# Patient Record
Sex: Female | Born: 1980 | Race: White | Hispanic: No | Smoking: Current every day smoker
Health system: Southern US, Community
[De-identification: ages and names within clinical notes are randomized; demographics above are authoritative.]

## PROBLEM LIST (undated history)

## (undated) DIAGNOSIS — F329 Major depressive disorder, single episode, unspecified: Secondary | ICD-10-CM

## (undated) DIAGNOSIS — F32A Depression, unspecified: Secondary | ICD-10-CM

## (undated) DIAGNOSIS — F988 Other specified behavioral and emotional disorders with onset usually occurring in childhood and adolescence: Secondary | ICD-10-CM

## (undated) DIAGNOSIS — E079 Disorder of thyroid, unspecified: Secondary | ICD-10-CM

## (undated) HISTORY — DX: Disorder of thyroid, unspecified: E07.9

## (undated) HISTORY — DX: Other specified behavioral and emotional disorders with onset usually occurring in childhood and adolescence: F98.8

## (undated) HISTORY — DX: Major depressive disorder, single episode, unspecified: F32.9

## (undated) HISTORY — DX: Depression, unspecified: F32.A

---

## 2011-05-15 LAB — HEPATIC FUNCTION PANEL
AST: 14 U/L (ref 13–35)
Alkaline Phosphatase: 58 U/L (ref 25–125)

## 2011-05-15 LAB — BASIC METABOLIC PANEL
BUN: 12 mg/dL (ref 4–21)
Glucose: 93 mg/dL
Sodium: 138 mmol/L (ref 137–147)

## 2011-05-15 LAB — LIPID PANEL
HDL: 55 mg/dL (ref 35–70)
LDL Cholesterol: 92 mg/dL

## 2011-05-15 LAB — HEMOGLOBIN A1C: Hgb A1c MFr Bld: 5.1 % (ref 4.0–6.0)

## 2012-02-19 ENCOUNTER — Ambulatory Visit (INDEPENDENT_AMBULATORY_CARE_PROVIDER_SITE_OTHER): Payer: Self-pay | Admitting: Physician Assistant

## 2012-02-19 ENCOUNTER — Encounter: Payer: Self-pay | Admitting: Physician Assistant

## 2012-02-19 VITALS — BP 124/78 | HR 72 | Temp 98.1°F | Resp 18 | Ht 64.0 in | Wt 204.0 lb

## 2012-02-19 DIAGNOSIS — F32A Depression, unspecified: Secondary | ICD-10-CM

## 2012-02-19 DIAGNOSIS — N390 Urinary tract infection, site not specified: Secondary | ICD-10-CM

## 2012-02-19 DIAGNOSIS — F329 Major depressive disorder, single episode, unspecified: Secondary | ICD-10-CM | POA: Insufficient documentation

## 2012-02-19 DIAGNOSIS — E079 Disorder of thyroid, unspecified: Secondary | ICD-10-CM | POA: Insufficient documentation

## 2012-02-19 DIAGNOSIS — F988 Other specified behavioral and emotional disorders with onset usually occurring in childhood and adolescence: Secondary | ICD-10-CM

## 2012-02-19 DIAGNOSIS — R300 Dysuria: Secondary | ICD-10-CM | POA: Insufficient documentation

## 2012-02-19 DIAGNOSIS — Z23 Encounter for immunization: Secondary | ICD-10-CM

## 2012-02-19 DIAGNOSIS — E039 Hypothyroidism, unspecified: Secondary | ICD-10-CM

## 2012-02-19 LAB — POCT URINALYSIS DIPSTICK
Glucose, UA: NEGATIVE
Nitrite, UA: NEGATIVE
Urobilinogen, UA: 0.2

## 2012-02-19 MED ORDER — METHYLPHENIDATE HCL 10 MG PO TABS: 10.0000 mg | ORAL_TABLET | Freq: Two times a day (BID) | ORAL | Status: DC

## 2012-02-19 MED ORDER — CIPROFLOXACIN HCL 500 MG PO TABS: 500.0000 mg | ORAL_TABLET | Freq: Two times a day (BID) | ORAL | Status: DC

## 2012-02-19 MED ORDER — LEVOTHYROXINE SODIUM 25 MCG PO TABS: 25.0000 ug | ORAL_TABLET | Freq: Every day | ORAL | Status: DC

## 2012-02-19 MED ORDER — FLUOXETINE HCL 10 MG PO CAPS: 10.0000 mg | ORAL_CAPSULE | Freq: Every day | ORAL | Status: DC

## 2012-02-19 NOTE — Patient Instructions (Addendum)
Cipro for UTI.   Refilled medications. Follow up in 6 months.   Urinary Tract Infection Urinary tract infections (UTIs) can develop anywhere along your urinary tract. Your urinary tract is your body's drainage system for removing wastes and extra water. Your urinary tract includes two kidneys, two ureters, a bladder, and a urethra. Your kidneys are a pair of bean-shaped organs. Each kidney is about the size of your fist. They are located below your ribs, one on each side of your spine. CAUSES Infections are caused by microbes, which are microscopic organisms, including fungi, viruses, and bacteria. These organisms are so small that they can only be seen through a microscope. Bacteria are the microbes that most commonly cause UTIs. SYMPTOMS  Symptoms of UTIs may vary by age and gender of the patient and by the location of the infection. Symptoms in young women typically include a frequent and intense urge to urinate and a painful, burning feeling in the bladder or urethra during urination. Older women and men are more likely to be tired, shaky, and weak and have muscle aches and abdominal pain. A fever may mean the infection is in your kidneys. Other symptoms of a kidney infection include pain in your back or sides below the ribs, nausea, and vomiting. DIAGNOSIS To diagnose a UTI, your caregiver will ask you about your symptoms. Your caregiver also will ask to provide a urine sample. The urine sample will be tested for bacteria and white blood cells. White blood cells are made by your body to help fight infection. TREATMENT  Typically, UTIs can be treated with medication. Because most UTIs are caused by a bacterial infection, they usually can be treated with the use of antibiotics. The choice of antibiotic and length of treatment depend on your symptoms and the type of bacteria causing your infection. HOME CARE INSTRUCTIONS  If you were prescribed antibiotics, take them exactly as your caregiver  instructs you. Finish the medication even if you feel better after you have only taken some of the medication.  Drink enough water and fluids to keep your urine clear or pale yellow.  Avoid caffeine, tea, and carbonated beverages. They tend to irritate your bladder.  Empty your bladder often. Avoid holding urine for long periods of time.  Empty your bladder before and after sexual intercourse.  After a bowel movement, women should cleanse from front to back. Use each tissue only once. SEEK MEDICAL CARE IF:   You have back pain.  You develop a fever.  Your symptoms do not begin to resolve within 3 days. SEEK IMMEDIATE MEDICAL CARE IF:   You have severe back pain or lower abdominal pain.  You develop chills.  You have nausea or vomiting.  You have continued burning or discomfort with urination. MAKE SURE YOU:   Understand these instructions.  Will watch your condition.  Will get help right away if you are not doing well or get worse. Document Released: 11/28/2004 Document Revised: 08/20/2011 Document Reviewed: 03/29/2011 [facility] Patient Information 2013 [place], [place].

## 2012-02-20 ENCOUNTER — Telehealth: Payer: Self-pay | Admitting: *Deleted

## 2012-02-20 MED ORDER — FLUOXETINE HCL 20 MG PO TABS: 20.0000 mg | ORAL_TABLET | Freq: Every day | ORAL | Status: DC

## 2012-02-20 NOTE — Telephone Encounter (Signed)
Sent dose change to pharmacy

## 2012-02-20 NOTE — Telephone Encounter (Signed)
Sent appropriate dose to pharmacy.  

## 2012-02-20 NOTE — Telephone Encounter (Signed)
Pt came by office today and had bottle of Prozac and synthroid. States you sent in Prozac 10mg  and she is on the Prozac 20mg - synthroid dose was right. You had not finished your note so I could not tell if there was a supposed to be a change or not

## 2012-02-21 ENCOUNTER — Telehealth: Payer: Self-pay | Admitting: Physician Assistant

## 2012-02-21 ENCOUNTER — Telehealth: Payer: Self-pay | Admitting: *Deleted

## 2012-02-21 DIAGNOSIS — E039 Hypothyroidism, unspecified: Secondary | ICD-10-CM

## 2012-02-21 NOTE — Telephone Encounter (Signed)
Pt.notified

## 2012-02-21 NOTE — Telephone Encounter (Signed)
Please call patient and let her know I never printed out lab slip for [place] to recheck TSH to make sure on the best dose while she was here. We will fax order for TSH down and pt can go at anytime.

## 2012-02-21 NOTE — Telephone Encounter (Signed)
Patient notified she needs to come in to have TSH level drawn. Also, informed her new Prozac was called in.

## 2012-02-21 NOTE — Progress Notes (Signed)
  Subjective:    Patient ID: Mariah Gordon, female    DOB: 1980-07-19, 31 y.o.   MRN: [id number]  HPI Patient presents to the clinic as a new patient. She needs refills that were started about one month ago by another provider. She would like to establish here now. PMH reviewed and positive for new diagnosis of ADD(with no formal testing), Depression, and Hypothyroidism.   She does feel like she is doing much better on these medications. She has no complaints at this time regarding depression/ADD. She has not started back to work yet and states this would be the test if the Ritalin helps her focus.   She is having some urinary symptoms today. She has painful urination and feeling some abdominal pressure. She reports a hx of UTI's and knows when they are coming on. She has increased her water intake but not helped with symptoms. She has had for 2 days now. She denies any flank pain, N/V/D. No fever.   Her last pap was 01/18/12 and Pap was 06/2011.She has not had fasting labs done in a while but wants to wait until we get records or order bloodwork other than TSH.   She does have a family hx of lymphoma and HTN. She smokes daily about 1/2 pack a day for 10 years. She does not exercise.    Review of Systems     Objective:   Physical Exam  Constitutional: She appears well-developed and well-nourished.  HENT:  Head: Normocephalic and atraumatic.  Neck: Normal range of motion. Neck supple. No thyromegaly present.  Cardiovascular: Normal rate, regular rhythm and normal heart sounds.   Pulmonary/Chest: Effort normal and breath sounds normal. She has no wheezes.       No CVA tenderness.  Neurological: She is alert.  Skin: Skin is warm and dry.  Psychiatric: She has a normal mood and affect. Her behavior is normal.          Assessment & Plan:  Hypothyroidism- will recheck labs today. Refilled medications Follow up in 6 months.   Depression-Pt reports 10mg  of Prozac which I will refill  today for 6 months since no insurance. If depression becomes an issue please call office and we can talk about increasing since on such a low dose.   ADD- Will refill Ritalin. Had discussion that I do not start pts on Ritalin and rather choose an extended release instead of immediate release. If not getting the benefits we need to switch to see if we can get better control. Follow up in 3months or when needed. Once patient gets insurance I would also like her to see Dr. Dellia Cloud for a formal evaluation.   UTI- UA positive for Blood but since patient was symptomatic went ahead and treated with Cipro for 3 days. Saved pt culture since no insurance. If not improving call office. Symptomatic care given with handout.   Given Tdap and Flu vaccine without complications.

## 2012-02-22 LAB — TSH: TSH: 9.296 u[IU]/mL — ABNORMAL HIGH (ref 0.350–4.500)

## 2012-02-24 ENCOUNTER — Other Ambulatory Visit: Payer: Self-pay | Admitting: Physician Assistant

## 2012-02-24 MED ORDER — LEVOTHYROXINE SODIUM 75 MCG PO CAPS: 1.0000 | ORAL_CAPSULE | Freq: Every day | ORAL | Status: DC

## 2012-03-23 ENCOUNTER — Other Ambulatory Visit: Payer: Self-pay | Admitting: *Deleted

## 2012-03-23 MED ORDER — FLUOXETINE HCL 20 MG PO TABS: 20.0000 mg | ORAL_TABLET | Freq: Every day | ORAL | Status: DC

## 2012-03-25 ENCOUNTER — Encounter: Payer: Self-pay | Admitting: Physician Assistant

## 2012-03-25 DIAGNOSIS — E782 Mixed hyperlipidemia: Secondary | ICD-10-CM | POA: Insufficient documentation

## 2012-03-26 ENCOUNTER — Encounter: Payer: Self-pay | Admitting: *Deleted

## 2012-04-07 ENCOUNTER — Other Ambulatory Visit: Payer: Self-pay | Admitting: *Deleted

## 2012-04-07 MED ORDER — METHYLPHENIDATE HCL 10 MG PO TABS: 10.0000 mg | ORAL_TABLET | Freq: Two times a day (BID) | ORAL | Status: DC

## 2012-05-15 ENCOUNTER — Other Ambulatory Visit: Payer: Self-pay | Admitting: Physician Assistant

## 2012-06-05 ENCOUNTER — Other Ambulatory Visit: Payer: Self-pay | Admitting: *Deleted

## 2012-06-05 MED ORDER — METHYLPHENIDATE HCL 10 MG PO TABS: 10.0000 mg | ORAL_TABLET | Freq: Two times a day (BID) | ORAL | Status: DC

## 2012-06-08 ENCOUNTER — Encounter: Payer: Self-pay | Admitting: Physician Assistant

## 2012-06-08 ENCOUNTER — Ambulatory Visit (INDEPENDENT_AMBULATORY_CARE_PROVIDER_SITE_OTHER): Payer: Self-pay | Admitting: Physician Assistant

## 2012-06-08 VITALS — BP 115/65 | HR 69 | Wt 231.0 lb

## 2012-06-08 DIAGNOSIS — F32A Depression, unspecified: Secondary | ICD-10-CM

## 2012-06-08 DIAGNOSIS — E039 Hypothyroidism, unspecified: Secondary | ICD-10-CM

## 2012-06-08 DIAGNOSIS — R635 Abnormal weight gain: Secondary | ICD-10-CM

## 2012-06-08 DIAGNOSIS — L659 Nonscarring hair loss, unspecified: Secondary | ICD-10-CM

## 2012-06-08 DIAGNOSIS — N926 Irregular menstruation, unspecified: Secondary | ICD-10-CM

## 2012-06-08 DIAGNOSIS — R195 Other fecal abnormalities: Secondary | ICD-10-CM

## 2012-06-08 DIAGNOSIS — F329 Major depressive disorder, single episode, unspecified: Secondary | ICD-10-CM

## 2012-06-08 DIAGNOSIS — F988 Other specified behavioral and emotional disorders with onset usually occurring in childhood and adolescence: Secondary | ICD-10-CM

## 2012-06-08 MED ORDER — METHYLPHENIDATE HCL 20 MG PO TABS: 20.0000 mg | ORAL_TABLET | Freq: Two times a day (BID) | ORAL | Status: DC

## 2012-06-08 MED ORDER — FLUOXETINE HCL 20 MG PO TABS: 20.0000 mg | ORAL_TABLET | Freq: Every day | ORAL | Status: DC

## 2012-06-08 NOTE — Patient Instructions (Addendum)
Food diary.    Gluten-Free Diet Gluten is a protein found in many grains. Gluten is present in wheat, rye, and barley. Gluten from wheat, rye, and barley protein interferes with the absorption of food in people with gluten sensitivity. It may also cause intestinal injury when eaten by individuals with gluten sensitivity.  A sample piece (biopsy) of the small intestine is usually required for a positive diagnosis of gluten sensitivity. Dietary treatment consists of eliminating foods and food ingredients from wheat, rye, and barley. When these are taken out of the diet completely, most people regain function of the small intestine. Strict compliance is important even during symptom-free periods. People with gluten sensitivity need to be on a gluten-free diet for a lifetime. During the first stages of treatment, some people will also need to restrict dairy products that contain lactose, which is a naturally occurring sugar. Lactose is difficult to absorb when the small intestines are damaged (lactose intolerance).  WHO NEEDS THIS DIET Some people who have certain diseases need to be on a gluten-free diet. These diseases include:  Celiac disease.  Nontropical sprue.  Gluten-sensitive enteropathy.  Dermatitis herpetiformis. SPECIAL NOTES  Read all labels because gluten may have been added as an incidental ingredient. Words to check for on the label include: flour, starch, durum flour, graham flour, phosphated flour, self-rising flour, semolina, farina, modified food starch, cereal, thickening, fillers, emulsifiers, any kind of malt flavoring, and hydrolyzed vegetable protein. A registered dietician can help you identify possible harmful ingredients in the foods you normally eat.  If you are not sure whether an ingredient contains gluten, check with the manufacturer. Note that some manufacturers may change ingredients without notice. Always read labels.  Since flour and cereal products are often  used in the preparation of foods, it is important to be aware of the methods of preparation used, as well as the foods themselves. This is especially true when you are dining out. Starches  Allowed: Only those prepared from arrowroot, corn, potato, rice, and bean flours. Rice wafers(*), pure cornmeal tortillas, popcorn, some crackers, and chips(*). Hot cereals made from cornmeal. Ask your dietician which specific hot and cold cereals are allowed. White or sweet potatoes, yams, hominy, rice or wild rice, and special gluten-free pasta. Some oriental rice noodles or bean noodles.  Avoid: All wheat and rye cereals, wheat germ, barley, bran, graham, malt, bulgur, and millet(-). NOTE: Avoid cereals containing malt as a flavoring, such as rice cereal. Regular noodles, spaghetti, macaroni, and most packaged rice mixes(*). All others containing wheat, rye, or barley. Vegetables  Allowed: All plain, fresh, frozen, or canned vegetables.  Avoid: Creamed vegetables(*) and vegetables canned in sauces(*). Any prepared with wheat, rye, or barley. Fruit  Allowed: All fresh, frozen, canned, or dried fruits. Fruit juices.  Avoid: Thickened or prepared fruits and some pie fillings(*). Meat and Meat Substitutes  Allowed: Meat, fish, poultry, or eggs prepared without added wheat, rye, or barley. Luncheon meat(*), frankfurters(*), and pure meat. All aged cheese and processed cheese products(*). Cottage cheese(+) and cream cheese(+). Dried beans, dried peas, and lentils.  Avoid: Any meat or meat alternate containing wheat, rye, barley, or gluten stabilizers. Bread-containing products, such as Swiss steak, croquettes, and meatloaf. Tuna canned in vegetable broth(*); [place] with HVP injected as part of the basting; any cheese product containing oat gum as an ingredient. Milk  Allowed: Milk. Yogurt made with allowed ingredients(*).  Avoid: Commercial chocolate milk which may have cereal added(*). Malted milk. Soups  and Combination Foods  Allowed: Homemade broth and soups made with allowed ingredients; some canned or frozen soups are allowed(*). Combination or prepared foods that do not contain gluten(*). Read labels.  Avoid: All soups containing wheat, rye, or barley flour. Bouillon and bouillon cubes that contain hydrolyzed vegetable protein (HVP). Combination or prepared foods that contain gluten(*). Desserts  Allowed:  Custard, some pudding mixes(*), homemade puddings from cornstarch, rice, and tapioca. Gelatin desserts, ices, and sherbet(*). Cake, cookies, and other desserts prepared with allowed flours. Some commercial ice creams(*). Ask your dietician about specific brands of dessert that are allowed.  Avoid: Cakes, cookies, doughnuts, and pastries that are prepared with wheat, rye, or barley flour. Some commercial ice creams(*), ice cream flavors which contain cookies, crumbs, or cheesecake(*). Ice cream cones. All commercially prepared mixes for cakes, cookies, and other desserts(*). Bread pudding and other puddings thickened with flour. Sweets  Allowed: Sugar, honey, syrup(*), molasses, jelly, jam, plain hard candy, marshmallows, gumdrops, homemade candies free from wheat, rye, or barley. Coconut.  Avoid: Commercial candies containing wheat, rye, or barley(*). Certain buttercrunch toffees are dusted with wheat flour. Ask your dietician about specific brands that are not allowed. Chocolate-coated nuts, which are often rolled in flour. Fats and Oils  Allowed: Butter, margarine, vegetable oil, sour cream(+), whipping cream, shortening, lard, cream, mayonnaise(*). Some commercial salad dressings(*). Peanut butter.  Avoid: Some commercial salad dressings(*). Beverages  Allowed: Coffee (regular or decaffeinated), tea, herbal tea (read label to be sure that no wheat flour has been added). Carbonated beverages and some root beers(*). Wine, sake, and distilled spirits, such as gin, vodka, and  whiskey.  Avoid:  Certain cereal beverages. Ask your dietician about specific brands that are not allowed. Beer (unless gluten-free), ale, malted milk, and some root beers, wine, and sake. Condiments/ Miscellaneous  Allowed: Salt, pepper, herbs, spices, extracts, and food colorings. Monosodium glutamate (MSG). Cider, rice, and wine vinegar. Baking soda and baking powder. Certain soy sauces. Ask your dietician about specific brands that are allowed. Nuts, coconut, chocolate, and pure cocoa powder.  Avoid: Some curry powder(*), some dry seasoning mixes(*), some gravy extracts(*), some meat sauces(*), some catsup(*), some prepared mustard(*), horseradish(*), some soy sauce(*), chip dips(*), and some chewing gum(*). Yeast extract (contains barley). Caramel color (may contain malt). Ask your dietician about specific brands of condiments to avoid. Flour and Thickening Agents  Allowed: Arrowroot starch (A); Corn bran (B); Corn flour (B,C,D); Corn germ (B); Cornmeal (B,C,D); Corn starch (A); Potato flour (B,C,E); Potato starch flour (B,C,E); Rice bran (B); Rice flours: Plain, brown (B,C,D,E), and Sweet (A,B,C,F). Rice polish (B,C,G); Soy flour (B,C,G); Tapioca starch (A). The flour and thickening agents described above are good for: (A) Good thickening agent (B) Good when combined with other flours (C) Best combined with milk and eggs in baked products (D) Best in grainy-textured products (E) Produces drier product than other flours (F) Produces moister product than other flours (G) Adds distinct flavor to product. Use in moderation. (*) Check labels and investigate any questionable ingredients.  (-) Additional research is needed before this product can be recommended. (+) Check vegetable gum used. SAMPLE MEAL PLAN Breakfast   Orange juice.  Banana.  Rice or corn cereal.  Toast (gluten-free bread).  Heart-healthy tub margarine.  Jam.  Milk.  Coffee or tea. Lunch  Chicken salad  sandwich (with gluten-free bread and mayonnaise).  Sliced tomatoes.  Heart-healthy tub margarine.  Apple.  Milk.  Coffee or tea. Dinner  [place].  Baked potato.  Broccoli.  Lettuce salad  with gluten-free dressing.  Gluten-free bread.  Custard.  Heart-healthy margarine.  Coffee or tea. These meal plans are provided as samples. Your daily meal plans will vary. Document Released: 02/18/2005 Document Revised: 08/20/2011 Document Reviewed: 03/31/2011 [facility] Patient Information 2013 [place], [place].  Irritable Bowel Syndrome Irritable Bowel Syndrome (IBS) is caused by a disturbance of normal bowel function. Other terms used are spastic colon, mucous colitis, and irritable colon. It does not require surgery, nor does it lead to cancer. There is no cure for IBS. But with proper diet, stress reduction, and medication, you will find that your problems (symptoms) will gradually disappear or improve. IBS is a common digestive disorder. It usually appears in late adolescence or early adulthood. Women develop it twice as often as men. CAUSES  After food has been digested and absorbed in the small intestine, waste material is moved into the colon (large intestine). In the colon, water and salts are absorbed from the undigested products coming from the small intestine. The remaining residue, or fecal material, is held for elimination. Under normal circumstances, gentle, rhythmic contractions on the bowel walls push the fecal material along the colon towards the rectum. In IBS, however, these contractions are irregular and poorly coordinated. The fecal material is either retained too long, resulting in constipation, or expelled too soon, producing diarrhea. SYMPTOMS  The most common symptom of IBS is pain. It is typically in the lower left side of the belly (abdomen). But it may occur anywhere in the abdomen. It can be felt as heartburn, backache, or even as a dull pain in the arms or  shoulders. The pain comes from excessive bowel-muscle spasms and from the buildup of gas and fecal material in the colon. This pain:  Can range from sharp belly (abdominal) cramps to a dull, continuous ache.  Usually worsens soon after eating.  Is typically relieved by having a bowel movement or passing gas. Abdominal pain is usually accompanied by constipation. But it may also produce diarrhea. The diarrhea typically occurs right after a meal or upon arising in the morning. The stools are typically soft and watery. They are often flecked with secretions (mucus). Other symptoms of IBS include:  Bloating.  Loss of appetite.  Heartburn.  Feeling sick to your stomach (nausea).  Belching  Vomiting  Gas. IBS may also cause a number of symptoms that are unrelated to the digestive system:  Fatigue.  Headaches.  Anxiety  Shortness of breath  Difficulty in concentrating.  Dizziness. These symptoms tend to come and go. DIAGNOSIS  The symptoms of IBS closely mimic the symptoms of other, more serious digestive disorders. So your caregiver may wish to perform a variety of additional tests to exclude these disorders. He/she wants to be certain of learning what is wrong (diagnosis). The nature and purpose of each test will be explained to you. TREATMENT A number of medications are available to help correct bowel function and/or relieve bowel spasms and abdominal pain. Among the drugs available are:  Mild, non-irritating laxatives for severe constipation and to help restore normal bowel habits.  Specific anti-diarrheal medications to treat severe or prolonged diarrhea.  Anti-spasmodic agents to relieve intestinal cramps.  Your caregiver may also decide to treat you with a mild tranquilizer or sedative during unusually stressful periods in your life. The important thing to remember is that if any drug is prescribed for you, make sure that you take it exactly as directed. Make sure  that your caregiver knows how well it worked for  you. HOME CARE INSTRUCTIONS   Avoid foods that are high in fat or oils. Some examples WUJ:[id number] cream, butter, frankfurters, sausage, and other fatty meats.  Avoid foods that have a laxative effect, such as fruit, fruit juice, and dairy products.  Cut out carbonated drinks, chewing gum, and "gassy" foods, such as beans and cabbage. This may help relieve bloating and belching.  Bran taken with plenty of liquids may help relieve constipation.  Keep track of what foods seem to trigger your symptoms.  Avoid emotionally charged situations or circumstances that produce anxiety.  Start or continue exercising.  Get plenty of rest and sleep. MAKE SURE YOU:   Understand these instructions.  Will watch your condition.  Will get help right away if you are not doing well or get worse. Document Released: 02/18/2005 Document Revised: 05/13/2011 Document Reviewed: 10/09/2007 [facility] Patient Information 2013 [place], [place].

## 2012-06-08 NOTE — Progress Notes (Signed)
  Subjective:    Patient ID: Mariah Gordon, female    DOB: 1980-08-03, 32 y.o.   MRN: [id number]  HPI Patient presents to the clinic to follow up on ADD medications and to address some other concerns.   ADD- feels like she is doing "ok" feels like her focus could be better and medication not lasting long enough. She knows there are better medication out there but due to no ins needs very cheap medications. Denies any anorexia, insomnia, CP, or palpitations.   Weight gain/hypothyroidism- She has actually gain almost 30lbs in 3 months since her last visit. She did start levothyroxine 3 months ago and TSH has not been checked. She is very concerned about weight gain, hair starting to fall out again and periods becoming more irregular. She admits she is not keeping to a diet and eats a lot of sweets but does not feel it should be a 30lb weight gain. She does have BTL. And took a negative pregnancy test.   Depression- controlled has been on prozac for awhile and does not think contributing to weight gain.   She is also having some mucus in her stools and sometimes they leak. She did use laxatives for weight loss years ago and wonders if that could be effecting colon. She admits to abdominal pain and cramping after eating. She has not tried any diet changes. Denies any blood in stool.    Review of Systems     Objective:   Physical Exam  Constitutional: She is oriented to person, place, and time. She appears well-developed and well-nourished.  Obese.  HENT:  Head: Normocephalic and atraumatic.  Thining of hair around top of head.   Neck: Normal range of motion. Neck supple. No thyromegaly present.  Cardiovascular: Normal rate, regular rhythm and normal heart sounds.   Pulmonary/Chest: Effort normal and breath sounds normal.  Abdominal: Soft. Bowel sounds are normal. She exhibits no distension and no mass. There is no tenderness. There is no rebound and no guarding.  Neurological: She is alert  and oriented to person, place, and time.  Skin: Skin is warm and dry.  Psychiatric: She has a normal mood and affect. Her behavior is normal.          Assessment & Plan:  Hypothyroidism-will recheck TSH today and adjust medications accordingly. Discussed with pt hair loss and weight gain should not be a side effect of levothyroxine infact it usually helps those things. Dicussed need for regular exercise and healthy diet. Which could help with weight loss.  Depression- Refilled prozac.   ADD- will increase ritalin today to 20mg  BID in hopes to improving focus and attention. Will follow up in 3 months.   Menstrual abnormalities- pt does not have insurance and concerned about price of all lab work. Discussed pychogenic causes of menstrual abnormalities. Checking TSH. Will add FSH, prolactin, LH, Testosterone.   Bowel changes- Mucus in stool and some leakage. Discussed could be IBS symptoms. Try some diet changes and food diaries and see if improves. Gave gluten free diet and ask pt to consider no dairy. If this does not help call office. If not cleared up should consider colonoscopy but aware will be costly.

## 2012-06-13 LAB — TSH: TSH: 2.766 u[IU]/mL (ref 0.350–4.500)

## 2012-07-31 ENCOUNTER — Other Ambulatory Visit: Payer: Self-pay | Admitting: *Deleted

## 2012-07-31 MED ORDER — LEVOTHYROXINE SODIUM 75 MCG PO TABS: ORAL_TABLET | ORAL | Status: DC

## 2012-08-19 ENCOUNTER — Ambulatory Visit: Payer: Self-pay | Admitting: Physician Assistant

## 2012-08-19 DIAGNOSIS — Z0289 Encounter for other administrative examinations: Secondary | ICD-10-CM

## 2012-10-23 ENCOUNTER — Other Ambulatory Visit: Payer: Self-pay | Admitting: Physician Assistant

## 2012-10-28 ENCOUNTER — Ambulatory Visit (INDEPENDENT_AMBULATORY_CARE_PROVIDER_SITE_OTHER): Payer: Self-pay | Admitting: Physician Assistant

## 2012-10-28 ENCOUNTER — Encounter: Payer: Self-pay | Admitting: Physician Assistant

## 2012-10-28 VITALS — BP 119/76 | HR 80 | Wt 229.0 lb

## 2012-10-28 DIAGNOSIS — F988 Other specified behavioral and emotional disorders with onset usually occurring in childhood and adolescence: Secondary | ICD-10-CM

## 2012-10-28 DIAGNOSIS — R109 Unspecified abdominal pain: Secondary | ICD-10-CM

## 2012-10-28 DIAGNOSIS — R300 Dysuria: Secondary | ICD-10-CM

## 2012-10-28 DIAGNOSIS — N390 Urinary tract infection, site not specified: Secondary | ICD-10-CM

## 2012-10-28 DIAGNOSIS — E079 Disorder of thyroid, unspecified: Secondary | ICD-10-CM

## 2012-10-28 LAB — POCT URINALYSIS DIPSTICK
Nitrite, UA: POSITIVE
Urobilinogen, UA: 0.2
pH, UA: 6

## 2012-10-28 MED ORDER — TRAMADOL HCL 50 MG PO TABS: 50.0000 mg | ORAL_TABLET | Freq: Three times a day (TID) | ORAL | Status: DC | PRN

## 2012-10-28 MED ORDER — LEVOTHYROXINE SODIUM 75 MCG PO TABS: 75.0000 ug | ORAL_TABLET | Freq: Every day | ORAL | Status: DC

## 2012-10-28 MED ORDER — METHYLPHENIDATE HCL 20 MG PO TABS: 20.0000 mg | ORAL_TABLET | Freq: Two times a day (BID) | ORAL | Status: DC

## 2012-10-28 MED ORDER — PHENAZOPYRIDINE HCL 200 MG PO TABS: 200.0000 mg | ORAL_TABLET | Freq: Three times a day (TID) | ORAL | Status: DC | PRN

## 2012-10-28 MED ORDER — NITROFURANTOIN MONOHYD MACRO 100 MG PO CAPS: 100.0000 mg | ORAL_CAPSULE | Freq: Two times a day (BID) | ORAL | Status: DC

## 2012-10-28 NOTE — Patient Instructions (Addendum)
Urinary Tract Infection  Urinary tract infections (UTIs) can develop anywhere along your urinary tract. Your urinary tract is your body's drainage system for removing wastes and extra water. Your urinary tract includes two kidneys, two ureters, a bladder, and a urethra. Your kidneys are a pair of bean-shaped organs. Each kidney is about the size of your fist. They are located below your ribs, one on each side of your spine.  CAUSES  Infections are caused by microbes, which are microscopic organisms, including fungi, viruses, and bacteria. These organisms are so small that they can only be seen through a microscope. Bacteria are the microbes that most commonly cause UTIs.  SYMPTOMS   Symptoms of UTIs may vary by age and gender of the patient and by the location of the infection. Symptoms in young women typically include a frequent and intense urge to urinate and a painful, burning feeling in the bladder or urethra during urination. Older women and men are more likely to be tired, shaky, and weak and have muscle aches and abdominal pain. A fever may mean the infection is in your kidneys. Other symptoms of a kidney infection include pain in your back or sides below the ribs, nausea, and vomiting.  DIAGNOSIS  To diagnose a UTI, your caregiver will ask you about your symptoms. Your caregiver also will ask to provide a urine sample. The urine sample will be tested for bacteria and white blood cells. White blood cells are made by your body to help fight infection.  TREATMENT   Typically, UTIs can be treated with medication. Because most UTIs are caused by a bacterial infection, they usually can be treated with the use of antibiotics. The choice of antibiotic and length of treatment depend on your symptoms and the type of bacteria causing your infection.  HOME CARE INSTRUCTIONS   If you were prescribed antibiotics, take them exactly as your caregiver instructs you. Finish the medication even if you feel better after you  have only taken some of the medication.   Drink enough water and fluids to keep your urine clear or pale yellow.   Avoid caffeine, tea, and carbonated beverages. They tend to irritate your bladder.   Empty your bladder often. Avoid holding urine for long periods of time.   Empty your bladder before and after sexual intercourse.   After a bowel movement, women should cleanse from front to back. Use each tissue only once.  SEEK MEDICAL CARE IF:    You have back pain.   You develop a fever.   Your symptoms do not begin to resolve within 3 days.  SEEK IMMEDIATE MEDICAL CARE IF:    You have severe back pain or lower abdominal pain.   You develop chills.   You have nausea or vomiting.   You have continued burning or discomfort with urination.  MAKE SURE YOU:    Understand these instructions.   Will watch your condition.   Will get help right away if you are not doing well or get worse.  Document Released: 11/28/2004 Document Revised: 08/20/2011 Document Reviewed: 03/29/2011  [employer] Patient Information 2014 [employer]

## 2012-10-28 NOTE — Progress Notes (Signed)
  Subjective:    Patient ID: Mariah Gordon, female    DOB: 07-Jul-1980, 32 y.o.   MRN: [id number]  HPI Patient presents to the clinic with dysuria, low back pain, lower abdominal pressure since yesterday. She does admit to having a urine odor for the last 2 days. Last night pain was so bad she almost at emergency room. She's been drinking more water but has not tried taking anything. She does have a history of urinary tract infections. She's not had one in a while because she is not sexually active. She denies any discharge. She does not had any fevers, chills, nausea, vomiting.  Hypothyroidism-needs refill. She denies any issues or difficulties.  A DD-patient is currently on Ritalin. She needs a refill today. She has not been working over the summer and did not take it but if starting back to work and needs to have refill. She denies any chest pains, palpitations, insomnia, anorexia.   Review of Systems     Objective:   Physical Exam  Constitutional: She is oriented to person, place, and time. She appears well-developed and well-nourished.  HENT:  Head: Normocephalic and atraumatic.  Neck: Normal range of motion. Neck supple. No thyromegaly present.  Cardiovascular: Normal rate, regular rhythm and normal heart sounds.   Pulmonary/Chest: Effort normal and breath sounds normal.  Negative for CVA tenderness.  Abdominal: Soft. Bowel sounds are normal.   Mild discomfort over the lower abdominal area.  Neurological: She is alert and oriented to person, place, and time.  Skin: Skin is warm and dry.  Psychiatric: She has a normal mood and affect. Her behavior is normal.          Assessment & Plan:  UTI-patient's urine dipstick was positive for blood, nitrates, leukocytes. She was treated with Macrobid and given Pyridium for bladder inflammation. TTT intense pain she was also given Ultram to use as needed. Vascular try anti-inflammatories also if needed. Gave handout on UTI. Encourage  hydration. If not starting to improve by Friday please call office.  Hypothyroidism- refilled Synthroid today.  ADHD-Ritalin was refilled today. Patient is good for the next 6 months. She may call in for refills.

## 2012-11-11 ENCOUNTER — Ambulatory Visit (INDEPENDENT_AMBULATORY_CARE_PROVIDER_SITE_OTHER): Payer: Self-pay | Admitting: Physician Assistant

## 2012-11-11 ENCOUNTER — Telehealth: Payer: Self-pay | Admitting: *Deleted

## 2012-11-11 ENCOUNTER — Encounter: Payer: Self-pay | Admitting: *Deleted

## 2012-11-11 VITALS — BP 119/60 | HR 71 | Wt 231.0 lb

## 2012-11-11 DIAGNOSIS — N390 Urinary tract infection, site not specified: Secondary | ICD-10-CM

## 2012-11-11 DIAGNOSIS — R82998 Other abnormal findings in urine: Secondary | ICD-10-CM

## 2012-11-11 LAB — POCT URINALYSIS DIPSTICK
Ketones, UA: NEGATIVE
Leukocytes, UA: NEGATIVE
Protein, UA: NEGATIVE
Urobilinogen, UA: 0.2
pH, UA: 7

## 2012-11-11 NOTE — Telephone Encounter (Signed)
Pt transferred to schedule appt for UA dipstick.  Meyer Cory, LPN

## 2012-11-11 NOTE — Progress Notes (Signed)
  Subjective:    Patient ID: Mariah Gordon, female    DOB: [birth year], 32 y.o.   MRN: [id number] Patient here for UA dip HPI    Review of Systems     Objective:   Physical Exam        Assessment & Plan:  UA positive for blood but no signs of infection. Will send urine for culture to make sure there is not a infection. If patient is having symptoms she needs to be seen so we can determine if they could be BV or yeast-related.Tandy Gaw PA-C

## 2012-11-11 NOTE — Telephone Encounter (Signed)
Pt states she finished the abx you gave her for UTI but states she is still having the same symptoms. She would like to know if you would send more abx to pharmacy.

## 2012-11-11 NOTE — Telephone Encounter (Signed)
I would like to at least recheck urine dipstick. I did not do a culture so not aware of sensitivies. Come back today and give sample.

## 2012-11-13 LAB — URINE CULTURE: Colony Count: >=100000

## 2012-11-14 ENCOUNTER — Other Ambulatory Visit: Payer: Self-pay | Admitting: Physician Assistant

## 2012-11-14 MED ORDER — CIPROFLOXACIN HCL 500 MG PO TABS: 500.0000 mg | ORAL_TABLET | Freq: Two times a day (BID) | ORAL | Status: DC

## 2013-01-11 ENCOUNTER — Other Ambulatory Visit: Payer: Self-pay

## 2013-01-11 MED ORDER — METHYLPHENIDATE HCL 20 MG PO TABS: 20.0000 mg | ORAL_TABLET | Freq: Two times a day (BID) | ORAL | Status: DC

## 2013-02-12 ENCOUNTER — Ambulatory Visit (INDEPENDENT_AMBULATORY_CARE_PROVIDER_SITE_OTHER): Payer: Self-pay | Admitting: Sports Medicine

## 2013-02-12 ENCOUNTER — Encounter: Payer: Self-pay | Admitting: Sports Medicine

## 2013-02-12 ENCOUNTER — Other Ambulatory Visit: Payer: Self-pay | Admitting: *Deleted

## 2013-02-12 VITALS — BP 143/78 | HR 92 | Wt 214.0 lb

## 2013-02-12 DIAGNOSIS — R42 Dizziness and giddiness: Secondary | ICD-10-CM

## 2013-02-12 DIAGNOSIS — E079 Disorder of thyroid, unspecified: Secondary | ICD-10-CM

## 2013-02-12 DIAGNOSIS — R10819 Abdominal tenderness, unspecified site: Secondary | ICD-10-CM

## 2013-02-12 DIAGNOSIS — R110 Nausea: Secondary | ICD-10-CM | POA: Insufficient documentation

## 2013-02-12 LAB — POCT URINALYSIS DIPSTICK
Bilirubin, UA: NEGATIVE
Blood, UA: NEGATIVE
Glucose, UA: NEGATIVE
Ketones, UA: NEGATIVE
Leukocytes, UA: NEGATIVE
Nitrite, UA: NEGATIVE
Protein, UA: NEGATIVE
Spec Grav, UA: 1.025
Urobilinogen, UA: 0.2
pH, UA: 7

## 2013-02-12 LAB — POCT URINE PREGNANCY: Preg Test, Ur: NEGATIVE

## 2013-02-12 MED ORDER — PROMETHAZINE HCL 25 MG PO TABS
25.0000 mg | ORAL_TABLET | Freq: Four times a day (QID) | ORAL | Status: DC | PRN
Start: 2013-02-12 — End: 2013-04-19

## 2013-02-12 MED ORDER — METHYLPHENIDATE HCL 20 MG PO TABS: 20.0000 mg | ORAL_TABLET | Freq: Two times a day (BID) | ORAL | Status: DC

## 2013-02-12 NOTE — Assessment & Plan Note (Signed)
This is likely to present a viral process. Phenergan 25 oral as needed. Return if no better in 2 weeks.

## 2013-02-12 NOTE — Assessment & Plan Note (Signed)
Last TSH was in April, she has been slightly well so we are going to recheck.

## 2013-02-12 NOTE — Progress Notes (Signed)
  Subjective:    CC: Nausea  HPI: This is a very pleasant 32 year old female with a history of hypothyroidism who comes in with a few day history of nausea and mild abdominal pain. She has mild suprapubic pain, but no urinary frequency or urgency, no diarrhea, no episodes of vomiting. No skin rash, no upper respiratory symptoms. No sick contacts. Symptoms are moderate, persistent.  Past medical history, Surgical history, Family history not pertinant except as noted below, Social history, Allergies, and medications have been entered into the medical record, reviewed, and no changes needed.   Review of Systems: No fevers, chills, night sweats, weight loss, chest pain, or shortness of breath.   Objective:    General: Well Developed, well nourished, and in no acute distress.  Neuro: Alert and oriented x3, extra-ocular muscles intact, sensation grossly intact.  HEENT: Normocephalic, atraumatic, pupils equal round reactive to light, neck supple, no masses, no lymphadenopathy, thyroid nonpalpable.  Skin: Warm and dry, no rashes. Cardiac: Regular rate and rhythm, no murmurs rubs or gallops, no lower extremity edema.  Respiratory: Clear to auscultation bilaterally. Not using accessory muscles, speaking in full sentences. Abdomen: Soft, nontender, nondistended, normal bowel sounds, no palpable masses.  Urinalysis was negative.  Impression and Recommendations:

## 2013-02-13 LAB — COMPREHENSIVE METABOLIC PANEL
Albumin: 4.7 g/dL (ref 3.5–5.2)
BUN: 13 mg/dL (ref 6–23)
CO2: 27 mEq/L (ref 19–32)
Glucose, Bld: 96 mg/dL (ref 70–99)
Potassium: 4.6 mEq/L (ref 3.5–5.3)
Sodium: 137 mEq/L (ref 135–145)
Total Protein: 7.2 g/dL (ref 6.0–8.3)

## 2013-02-13 LAB — COMPREHENSIVE METABOLIC PANEL WITH GFR
ALT: 16 U/L (ref 0–35)
AST: 17 U/L (ref 0–37)
Alkaline Phosphatase: 58 U/L (ref 39–117)
Calcium: 10.1 mg/dL (ref 8.4–10.5)
Chloride: 101 meq/L (ref 96–112)
Creat: 0.73 mg/dL (ref 0.50–1.10)
Total Bilirubin: 0.3 mg/dL (ref 0.3–1.2)

## 2013-02-13 LAB — CBC
HCT: 42.2 % (ref 36.0–46.0)
Hemoglobin: 14.1 g/dL (ref 12.0–15.0)
MCH: 28.5 pg (ref 26.0–34.0)
MCHC: 33.4 g/dL (ref 30.0–36.0)
MCV: 85.4 fL (ref 78.0–100.0)
Platelets: 493 K/uL — ABNORMAL HIGH (ref 150–400)
RBC: 4.94 MIL/uL (ref 3.87–5.11)
RDW: 15.3 % (ref 11.5–15.5)
WBC: 9.9 K/uL (ref 4.0–10.5)

## 2013-02-13 LAB — TSH: TSH: 2.6 u[IU]/mL (ref 0.350–4.500)

## 2013-03-15 ENCOUNTER — Other Ambulatory Visit: Payer: Self-pay

## 2013-03-15 MED ORDER — METHYLPHENIDATE HCL 20 MG PO TABS
20.0000 mg | ORAL_TABLET | Freq: Two times a day (BID) | ORAL | Status: DC
Start: 2013-03-15 — End: 2013-04-14

## 2013-04-14 ENCOUNTER — Other Ambulatory Visit: Payer: Self-pay | Admitting: *Deleted

## 2013-04-14 ENCOUNTER — Ambulatory Visit: Payer: Self-pay | Admitting: Physician Assistant

## 2013-04-14 MED ORDER — METHYLPHENIDATE HCL 20 MG PO TABS
20.0000 mg | ORAL_TABLET | Freq: Two times a day (BID) | ORAL | Status: DC
Start: 2013-04-14 — End: 2013-04-19

## 2013-04-19 ENCOUNTER — Encounter: Payer: Self-pay | Admitting: Physician Assistant

## 2013-04-19 ENCOUNTER — Ambulatory Visit (INDEPENDENT_AMBULATORY_CARE_PROVIDER_SITE_OTHER): Payer: BC Managed Care – PPO | Admitting: Physician Assistant

## 2013-04-19 VITALS — BP 125/62 | HR 85 | Wt 205.0 lb

## 2013-04-19 DIAGNOSIS — E079 Disorder of thyroid, unspecified: Secondary | ICD-10-CM

## 2013-04-19 DIAGNOSIS — F988 Other specified behavioral and emotional disorders with onset usually occurring in childhood and adolescence: Secondary | ICD-10-CM

## 2013-04-19 MED ORDER — METHYLPHENIDATE HCL 20 MG PO TABS: 20.0000 mg | ORAL_TABLET | Freq: Two times a day (BID) | ORAL | Status: DC

## 2013-04-19 NOTE — Progress Notes (Signed)
   Subjective:    Patient ID: Mariah RileHilary Gordon, female    DOB: 02-20-1981, 33 y.o.   MRN: [id number]  HPI  Patient is a 33 year old female who presents to the clinic to followup on ADD. Patient is stable on Ritalin. She is not able to afford extended release medication at this time. She does not have very good health insurance. She is taking Ritalin in the morning and about 1:00 PM. She does feel like the Ritalin wears off before she's able to get her homework done after work. She denies any palpitations, insomnia, anorexia. She tolerates the medication well and can tell that it helps her during the day.  Thyroidism-patient's last level was checked in December and great. She does not need refills today. She continues to take daily.  Review of Systems     Objective:   Physical Exam  Constitutional: She is oriented to person, place, and time. She appears well-developed and well-nourished.  HENT:  Head: Normocephalic and atraumatic.  Neck: Normal range of motion. Neck supple. No thyromegaly present.  Cardiovascular: Normal rate, regular rhythm and normal heart sounds.   Pulmonary/Chest: Effort normal and breath sounds normal.  Neurological: She is alert and oriented to person, place, and time.  Psychiatric: She has a normal mood and affect. Her behavior is normal.          Assessment & Plan:  ADD-refilled Ritalin for 3 months. Encouraged patient may be to the second dose back to around 2:00. Followup in 6 months.  Thyroid disease-patient does not need refill today for a labs.

## 2013-04-29 ENCOUNTER — Other Ambulatory Visit: Payer: Self-pay | Admitting: Physician Assistant

## 2013-05-21 ENCOUNTER — Ambulatory Visit: Payer: BC Managed Care – PPO | Admitting: Physician Assistant

## 2013-06-14 ENCOUNTER — Ambulatory Visit (INDEPENDENT_AMBULATORY_CARE_PROVIDER_SITE_OTHER): Payer: BC Managed Care – PPO | Admitting: Physician Assistant

## 2013-06-14 ENCOUNTER — Encounter: Payer: Self-pay | Admitting: Physician Assistant

## 2013-06-14 VITALS — BP 107/60 | HR 68 | Ht 64.0 in | Wt 196.0 lb

## 2013-06-14 DIAGNOSIS — N898 Other specified noninflammatory disorders of vagina: Secondary | ICD-10-CM

## 2013-06-14 DIAGNOSIS — R110 Nausea: Secondary | ICD-10-CM

## 2013-06-14 DIAGNOSIS — R109 Unspecified abdominal pain: Secondary | ICD-10-CM

## 2013-06-14 DIAGNOSIS — R1031 Right lower quadrant pain: Secondary | ICD-10-CM

## 2013-06-14 LAB — WET PREP FOR TRICH, YEAST, CLUE
CLUE CELLS WET PREP: NONE SEEN
TRICH WET PREP: NONE SEEN
Yeast Wet Prep HPF POC: NONE SEEN

## 2013-06-14 MED ORDER — OMEPRAZOLE 40 MG PO CPDR: 40.0000 mg | DELAYED_RELEASE_CAPSULE | Freq: Every day | ORAL | Status: DC

## 2013-06-14 NOTE — Patient Instructions (Addendum)
STart probiotic daily.  Start omeprazole daily.  Will get gallbladder ultrasound.

## 2013-06-14 NOTE — Progress Notes (Signed)
   Subjective:    Patient ID: Mariah RileHilary Denault, female    DOB: 1981-02-27, 33 y.o.   MRN: [id number]  HPI Pt is a 33 yo female who presents to the clinic with 5 months of pain after eating in the upper abdomen. Pain occurs almost every time she eats. Pain recently has been getting worse. Pain will last for 3ish hours almost until next meal and then will resolve before eating again. She denies any blood or black tarry stools. Bowel movements have not changed. Cut out gluten and helped with bloating but not with pain. Not aware if certain foods make worse. Tums tried with no relief. Describes pain as stabbing. Denies any acid reflux symptoms. No fever, chills. She does have some nausea but no vomiting.   Over last month brownish vaginal discharge. No itchy or pain associated. She has been spotting more this month. No consitutional symptoms.    Review of Systems     Objective:   Physical Exam  Constitutional: She is oriented to person, place, and time. She appears well-developed and well-nourished.  HENT:  Head: Normocephalic and atraumatic.  Cardiovascular: Normal rate, regular rhythm and normal heart sounds.   Pulmonary/Chest: Effort normal and breath sounds normal.  Abdominal: Soft. Bowel sounds are normal. She exhibits no distension and no mass. There is no rebound and no guarding.  Mild tenderness over right lower quadrant to palpation.  Negative murphys sign or LUQ tenderness.   Neurological: She is alert and oriented to person, place, and time.  Psychiatric: She has a normal mood and affect. Her behavior is normal.          Assessment & Plan:  Epigastric abdominal pain- could certainly be a variant of acid reflux or gastritis. Due to duration and no stools changes less suspicious of GI bleed/ulcer. Would like to try a trial of omeprazole in am not in combination with levothyroxine. Pt aware NOT to take together. Will get abdominal U/S due to duration of pain. CMP, lipase, amylase  were ordered. If not improving could consider h. Pylori testing or further imaging. If not improving or if labs no answer can get with GI. I would like to give omeprazole a few weeks to see if working. Certainly could try bentyl for muscle spasm control if needed.    RLQ tenderness/vaginal discharge- WEt prep ordered. Would like to do GC/Chlamydia pt declined today. There was tenderness over RLQ. Will get pelvic ultrasound.

## 2013-06-16 ENCOUNTER — Ambulatory Visit (INDEPENDENT_AMBULATORY_CARE_PROVIDER_SITE_OTHER): Payer: BC Managed Care – PPO

## 2013-06-16 ENCOUNTER — Other Ambulatory Visit: Payer: Self-pay | Admitting: Physician Assistant

## 2013-06-16 DIAGNOSIS — R102 Pelvic and perineal pain: Secondary | ICD-10-CM

## 2013-06-16 DIAGNOSIS — R141 Gas pain: Secondary | ICD-10-CM

## 2013-06-16 DIAGNOSIS — R143 Flatulence: Secondary | ICD-10-CM

## 2013-06-16 DIAGNOSIS — R109 Unspecified abdominal pain: Secondary | ICD-10-CM

## 2013-06-16 DIAGNOSIS — R110 Nausea: Secondary | ICD-10-CM

## 2013-06-16 DIAGNOSIS — R142 Eructation: Secondary | ICD-10-CM

## 2013-08-17 ENCOUNTER — Other Ambulatory Visit: Payer: Self-pay | Admitting: Physician Assistant

## 2013-09-24 ENCOUNTER — Other Ambulatory Visit: Payer: Self-pay | Admitting: Physician Assistant

## 2013-10-21 ENCOUNTER — Other Ambulatory Visit: Payer: Self-pay | Admitting: Physician Assistant

## 2013-12-05 ENCOUNTER — Other Ambulatory Visit: Payer: Self-pay | Admitting: Physician Assistant

## 2013-12-09 ENCOUNTER — Telehealth: Payer: Self-pay | Admitting: Physician Assistant

## 2013-12-09 ENCOUNTER — Ambulatory Visit: Payer: BC Managed Care – PPO | Admitting: Physician Assistant

## 2013-12-09 NOTE — Telephone Encounter (Signed)
Call pt: did not show for appt. If symptoms worsening or not improving please feel free follow up.

## 2014-02-17 ENCOUNTER — Ambulatory Visit (INDEPENDENT_AMBULATORY_CARE_PROVIDER_SITE_OTHER): Payer: BC Managed Care – PPO | Admitting: Family Medicine

## 2014-02-17 ENCOUNTER — Encounter: Payer: Self-pay | Admitting: Family Medicine

## 2014-02-17 VITALS — BP 132/72 | HR 94 | Temp 97.7°F | Wt 170.0 lb

## 2014-02-17 DIAGNOSIS — N3001 Acute cystitis with hematuria: Secondary | ICD-10-CM | POA: Diagnosis not present

## 2014-02-17 DIAGNOSIS — R300 Dysuria: Secondary | ICD-10-CM | POA: Diagnosis not present

## 2014-02-17 LAB — POCT URINALYSIS DIPSTICK
BILIRUBIN UA: NEGATIVE
Glucose, UA: NEGATIVE
Ketones, UA: NEGATIVE
NITRITE UA: NEGATIVE
PH UA: 7.5
Protein, UA: 100
Spec Grav, UA: 1.02
UROBILINOGEN UA: 0.2

## 2014-02-17 MED ORDER — CIPROFLOXACIN HCL 500 MG PO TABS: 500.0000 mg | ORAL_TABLET | Freq: Two times a day (BID) | ORAL | Status: AC

## 2014-02-17 NOTE — Progress Notes (Signed)
   Subjective:    Patient ID: Mariah Gordon, female    DOB: Nov 16, 1980, 33 y.o.   MRN: [id number]  HPI Symptoms 1 month. She has had pelvic pressure and dysuria. She's been trying to increase her water intake and drinking cranberry juice. She says she does get these frequently. She has had some blood in the urine as well as some low back pain with this. No fever or vomiting. NO recent antibiotics.    Review of Systems     Objective:   Physical Exam  Constitutional: She is oriented to person, place, and time. She appears well-developed and well-nourished.  HENT:  Head: Normocephalic and atraumatic.  Cardiovascular: Normal rate, regular rhythm and normal heart sounds.   Pulmonary/Chest: Effort normal and breath sounds normal.  Musculoskeletal:  No CVA tenderness  Neurological: She is alert and oriented to person, place, and time.  Skin: Skin is warm and dry.  Psychiatric: She has a normal mood and affect. Her behavior is normal.          Assessment & Plan:  UTI-analysis shows blood, leukocytes and protein. We'll go ahead and treat with ciprofloxacin x 5 days.  Call if not better in 5 days.  Continue to push fluids.

## 2014-02-17 NOTE — Patient Instructions (Signed)

## 2014-04-12 ENCOUNTER — Encounter: Payer: Self-pay | Admitting: Physician Assistant

## 2014-04-12 ENCOUNTER — Ambulatory Visit (INDEPENDENT_AMBULATORY_CARE_PROVIDER_SITE_OTHER): Payer: 59 | Admitting: Physician Assistant

## 2014-04-12 VITALS — BP 125/70 | HR 76 | Temp 98.2°F | Ht 64.0 in | Wt 173.0 lb

## 2014-04-12 DIAGNOSIS — B001 Herpesviral vesicular dermatitis: Secondary | ICD-10-CM

## 2014-04-12 DIAGNOSIS — N3000 Acute cystitis without hematuria: Secondary | ICD-10-CM

## 2014-04-12 DIAGNOSIS — N390 Urinary tract infection, site not specified: Secondary | ICD-10-CM

## 2014-04-12 DIAGNOSIS — R300 Dysuria: Secondary | ICD-10-CM

## 2014-04-12 DIAGNOSIS — R82998 Other abnormal findings in urine: Secondary | ICD-10-CM

## 2014-04-12 LAB — POCT URINALYSIS DIPSTICK
Bilirubin, UA: NEGATIVE
Blood, UA: NEGATIVE
Glucose, UA: NEGATIVE
Ketones, UA: NEGATIVE
Nitrite, UA: NEGATIVE
PROTEIN UA: NEGATIVE
SPEC GRAV UA: 1.02
UROBILINOGEN UA: 1
pH, UA: 7

## 2014-04-12 MED ORDER — VALACYCLOVIR HCL 1 G PO TABS: ORAL_TABLET | ORAL | Status: AC

## 2014-04-12 MED ORDER — CIPROFLOXACIN HCL 500 MG PO TABS: 500.0000 mg | ORAL_TABLET | Freq: Two times a day (BID) | ORAL | Status: AC

## 2014-04-12 MED ORDER — NITROFURANTOIN MONOHYD MACRO 100 MG PO CAPS: ORAL_CAPSULE | ORAL | Status: AC

## 2014-04-12 NOTE — Progress Notes (Signed)
   Subjective:    Patient ID: Mariah RileHilary Gordon, female    DOB: 09-Aug-1980, 34 y.o.   MRN: [id number]  HPI Pt presents to the clinic with 2 days of painful urination, increased frequency and urge to urinate. No fever, chills, n/v/d. No flank or abdominal pain. Hx of UTI's that seem to be more frequent lately. They do seem to be related to sex. Not tried anything to make better.   Fever blister started yesterday. Not done anything to treat. Would like something.    Review of Systems  All other systems reviewed and are negative.      Objective:   Physical Exam  Constitutional: She is oriented to person, place, and time. She appears well-developed and well-nourished.  HENT:  Head: Normocephalic and atraumatic.    Cardiovascular: Normal rate, regular rhythm and normal heart sounds.   Pulmonary/Chest: Effort normal and breath sounds normal.  No CVA tenderness.   Abdominal: Soft. Bowel sounds are normal. She exhibits no distension and no mass. There is no tenderness. There is no rebound and no guarding.  Neurological: She is alert and oriented to person, place, and time.  Skin: Skin is dry.  Psychiatric: She has a normal mood and affect. Her behavior is normal.          Assessment & Plan:  Acute cystitis- treated with cipro for 5days. Increase hydration. HO given. Follow up if not improving. Given macrobid to take after intercourse to prevent UTI.   Fever blister- valtrex given to use for this outbreak and refill for future.

## 2014-04-12 NOTE — Patient Instructions (Signed)

## 2014-04-14 LAB — URINE CULTURE: Colony Count: 75000

## 2014-05-20 ENCOUNTER — Telehealth: Payer: Self-pay | Admitting: *Deleted

## 2014-05-20 NOTE — Telephone Encounter (Signed)
Called patient with results & home # no longer working & work # patient is no longer working there..Mariah Gordon

## 2014-05-20 NOTE — Telephone Encounter (Signed)
-----   Message from Jomarie LongsJade L Breeback, PA-C sent at 04/15/2014  7:22 AM EST ----- Call pt: no one bacteria present so not able to do sensitivies. Do you feel better?

## 2016-01-28 IMAGING — US US ABDOMEN COMPLETE
1 series · 14 of 25 positions shown · non-contrast
Comparison: None.

CLINICAL DATA: Abdominal pain

EXAM:
ULTRASOUND ABDOMEN COMPLETE

[Series 1: us abdomen complete · 0.32mm/px · 14 of 74 slices shown]
[im 1/74]
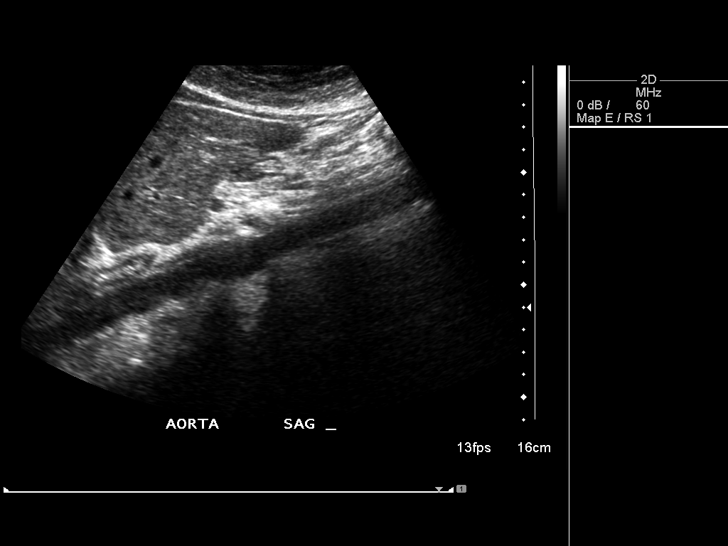
[im 7/74]
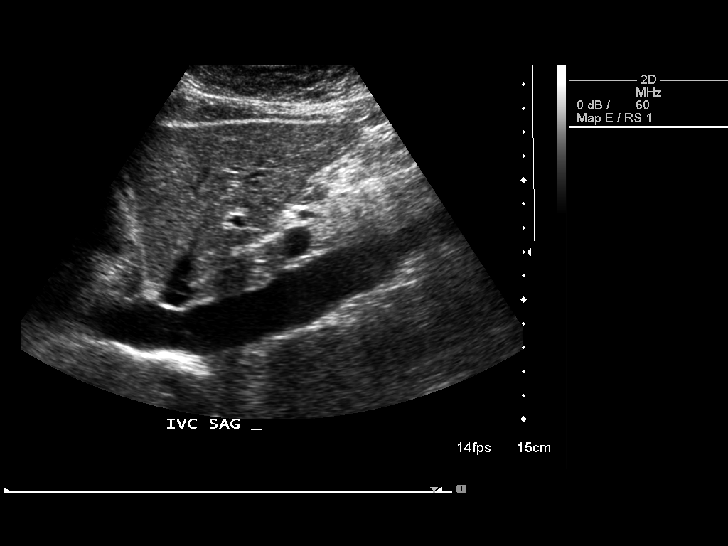
[im 13/74]
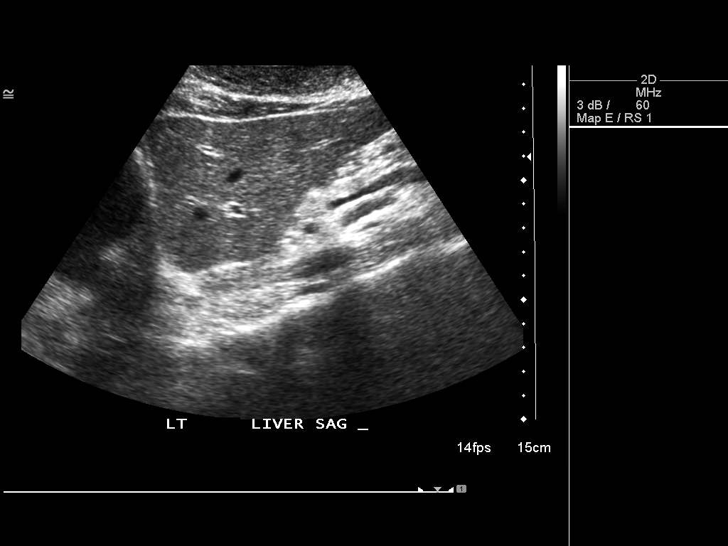
[im 19/74]
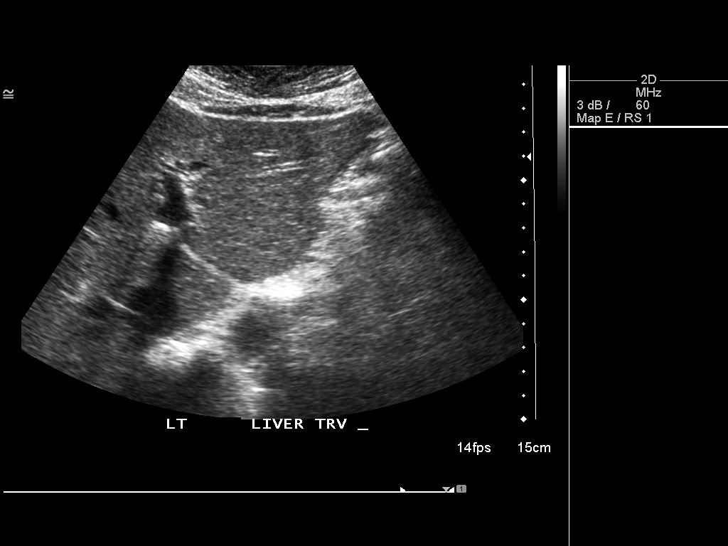
[im 25/74]
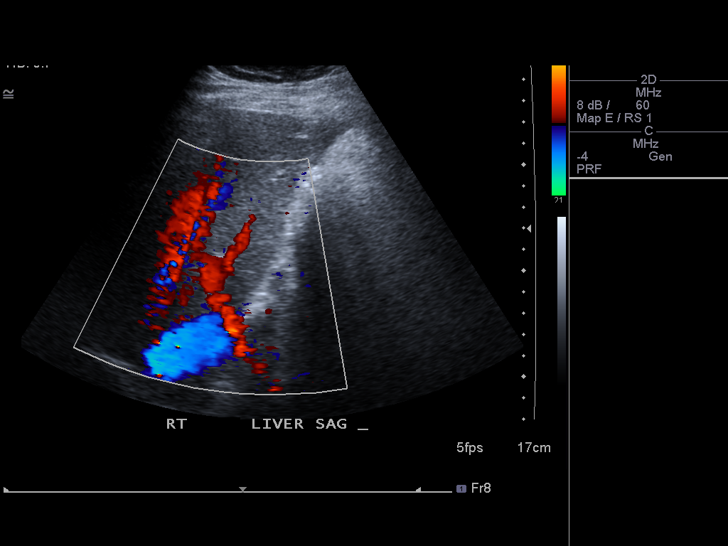
[im 28/74]
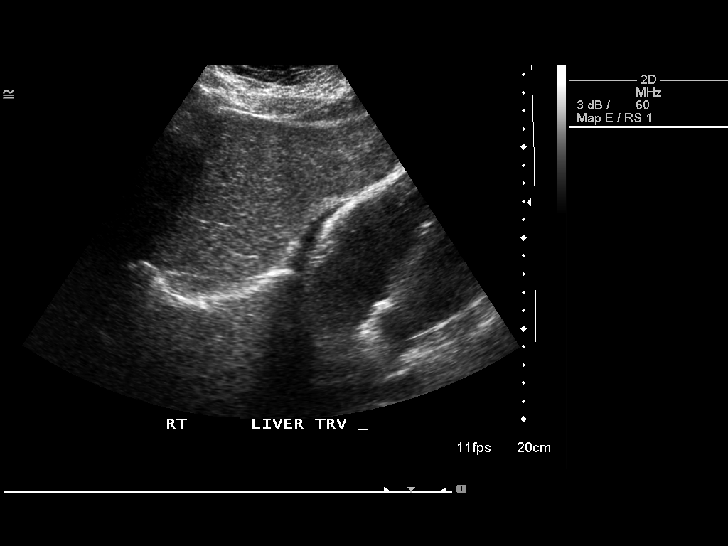
[im 34/74]
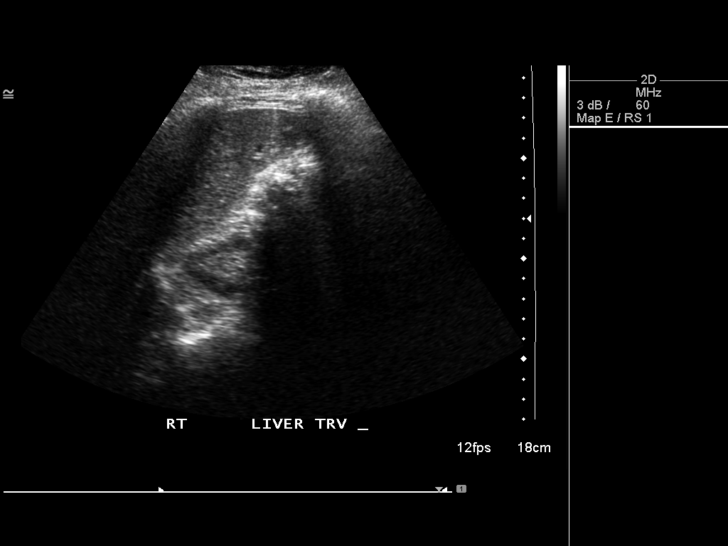
[im 40/74]
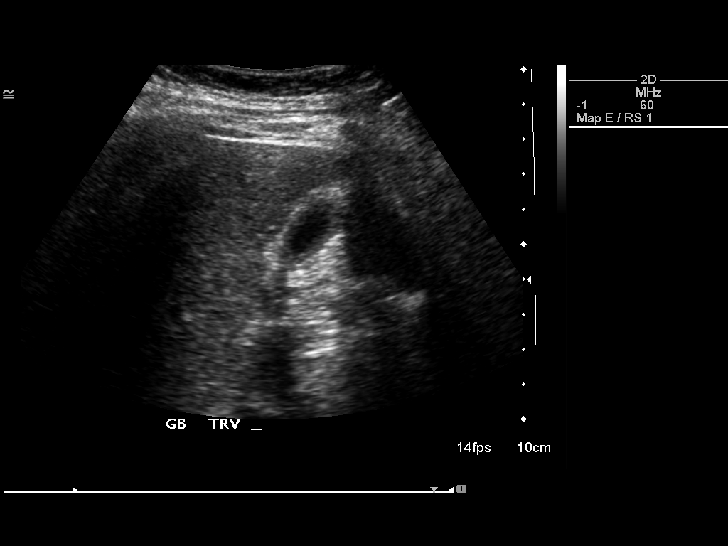
[im 46/74]
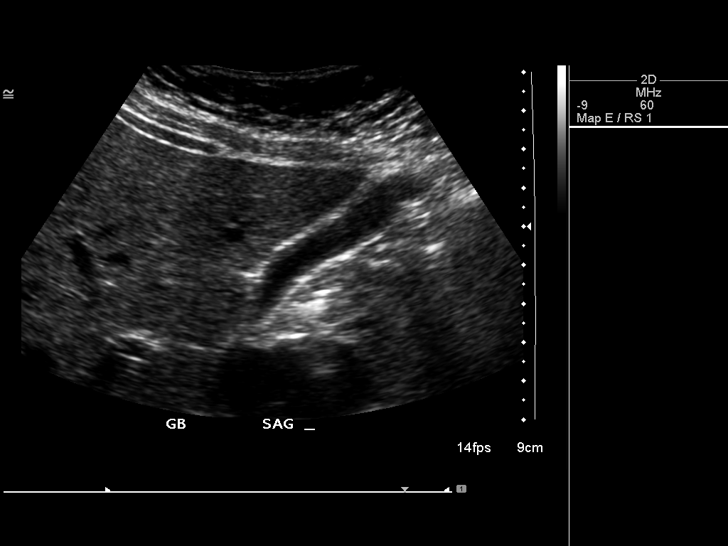
[im 49/74]
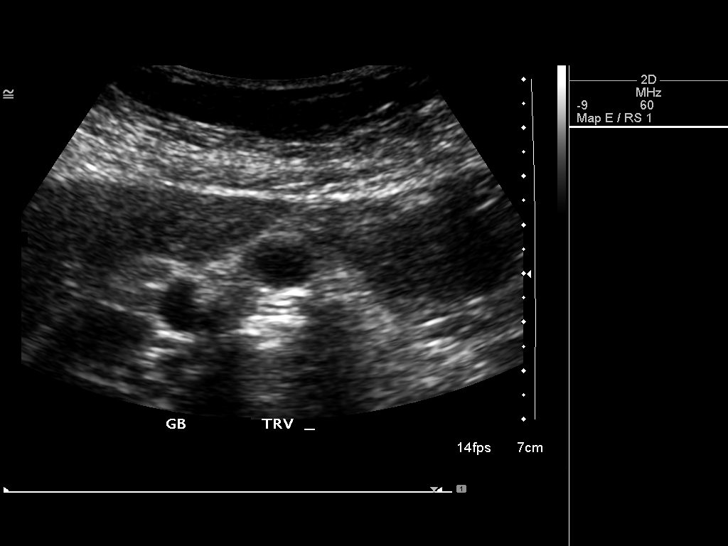
[im 55/74]
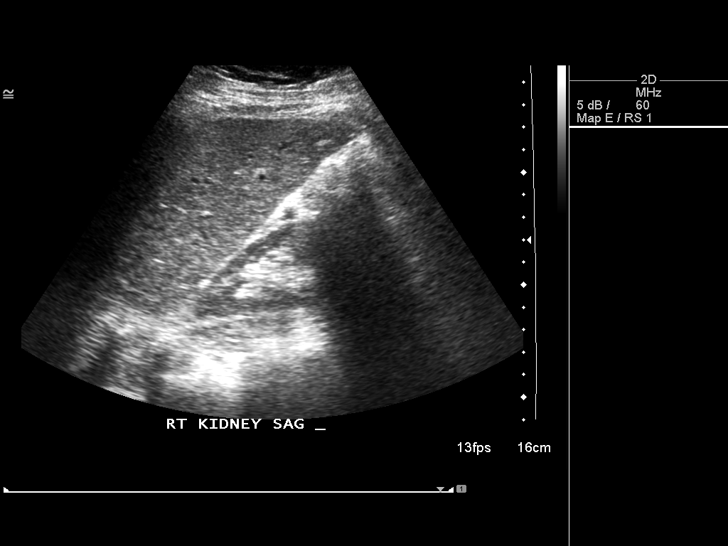
[im 61/74]
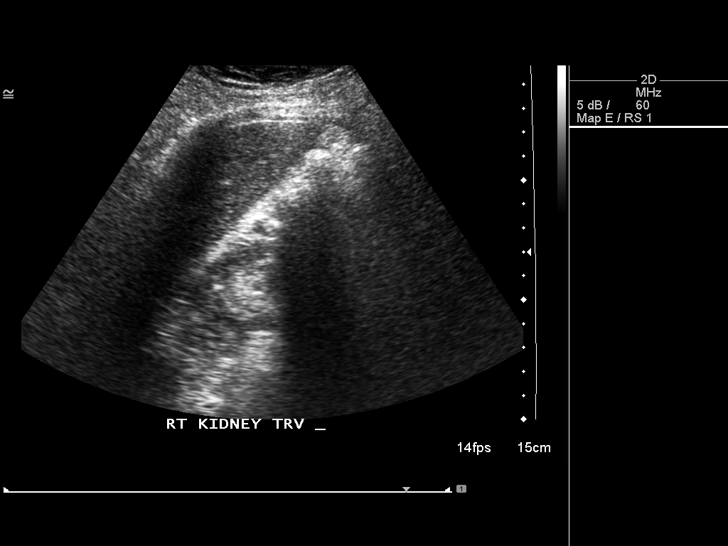
[im 67/74]
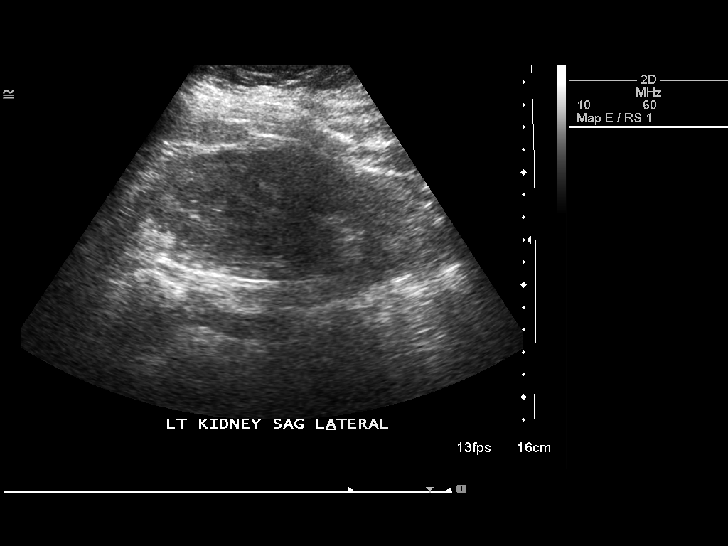
[im 74/74]
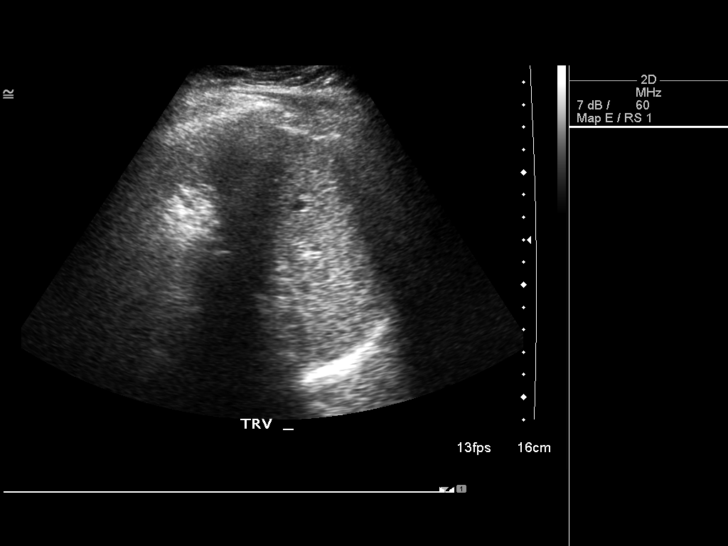

[14 of 25 positions shown; findings below may reference images not displayed]

FINDINGS: Gallbladder:

No gallstones or wall thickening visualized. No sonographic Murphy
sign noted.

Common bile duct:

Diameter: 3 mm in caliber.

Liver:

Normal echogenicity.  No focal mass.

IVC:

No abnormality visualized.

Pancreas:

Visualized portion unremarkable. The pancreas is partially obscured.

Spleen:

Size and appearance within normal limits.

Right Kidney:

Length: 9.9 cm in length. Views of the right kidney were limited due
to overlying bowel gas. Echogenicity within normal limits. No mass
or hydronephrosis visualized.

Left Kidney:

Length: 12.3 cm in length. Echogenicity within normal limits. No
mass or hydronephrosis visualized.

Abdominal aorta:

Portions of the aorta were obscured.  No obvious aneurysm.

Other findings:

None.
IMPRESSION: Limited examination as described. No obvious acute intra-abdominal
pathology.

## 2016-01-28 IMAGING — US US PELVIS COMPLETE
1 series · 14 of 25 positions shown · non-contrast
Comparison: None

CLINICAL DATA: Right lower quadrant pain.

EXAM:
TRANSABDOMINAL AND TRANSVAGINAL ULTRASOUND OF PELVIS
TECHNIQUE: Both transabdominal and transvaginal ultrasound examinations of the
pelvis were performed. Transabdominal technique was performed for
global imaging of the pelvis including uterus, ovaries, adnexal
regions, and pelvic cul-de-sac. It was necessary to proceed with
endovaginal exam following the transabdominal exam to visualize the
uterus, endometrium, ovaries and adnexa .

[Series 1: us pelvis complete · 0.30mm/px · 14 of 71 slices shown]
[im 1/71]
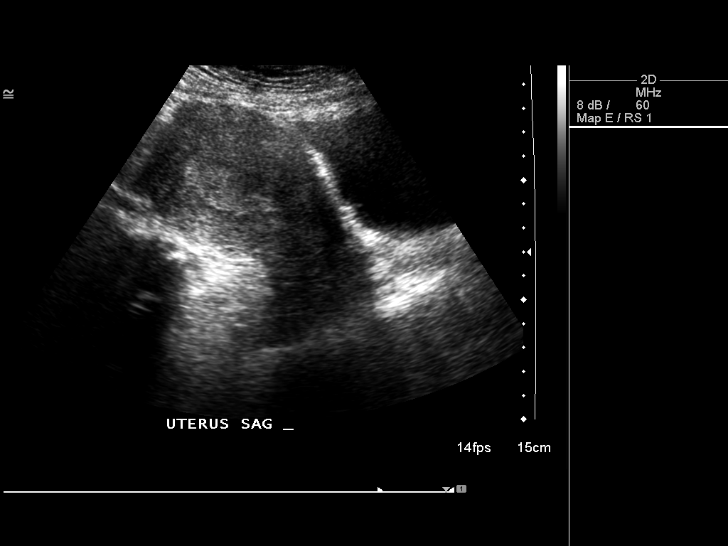
[im 6/71]
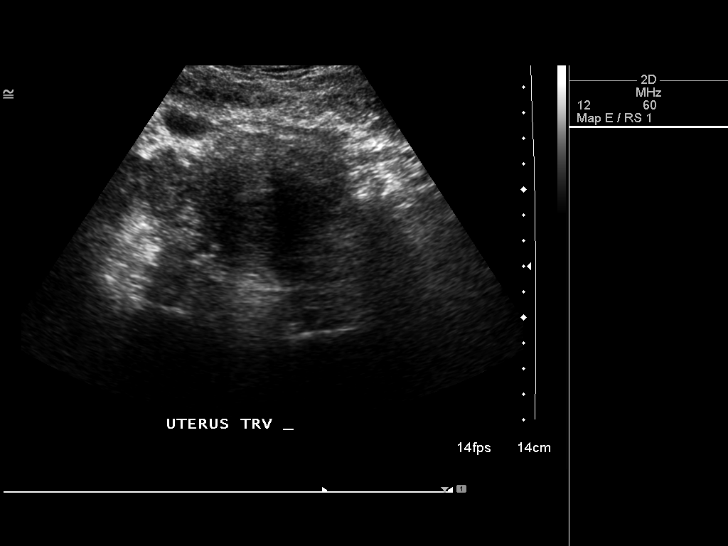
[im 12/71]
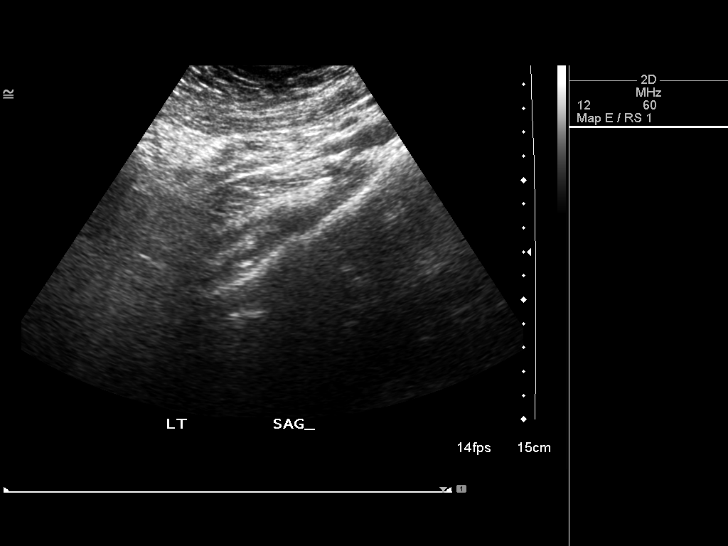
[im 18/71]
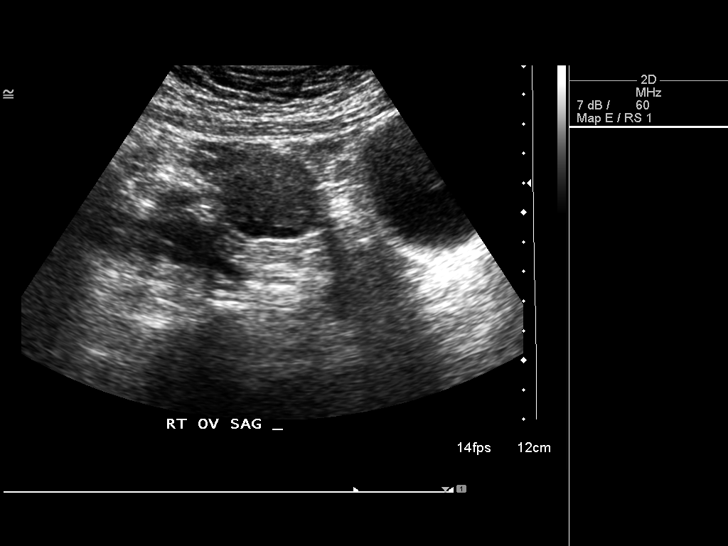
[im 24/71]
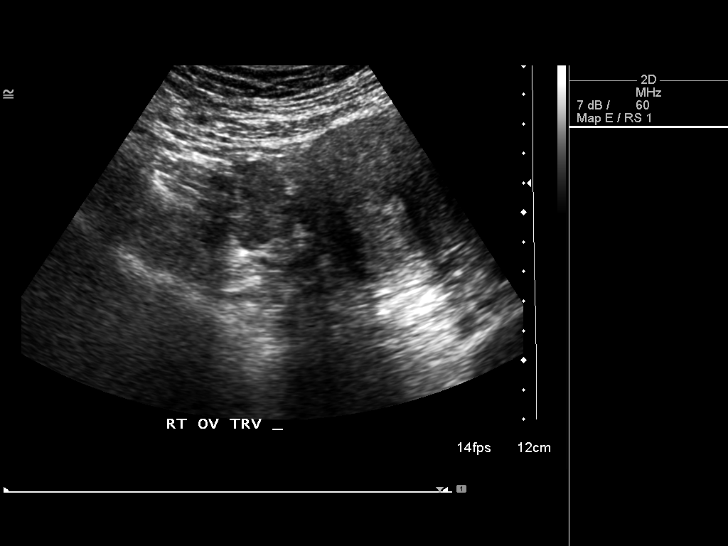
[im 27/71]
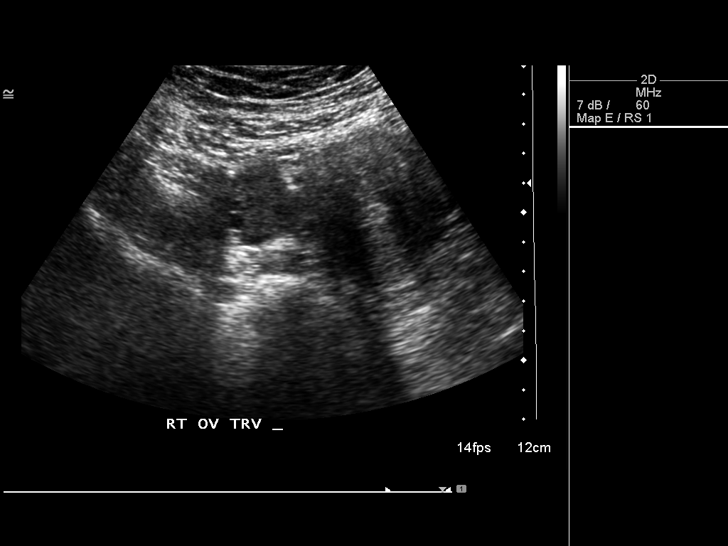
[im 33/71]
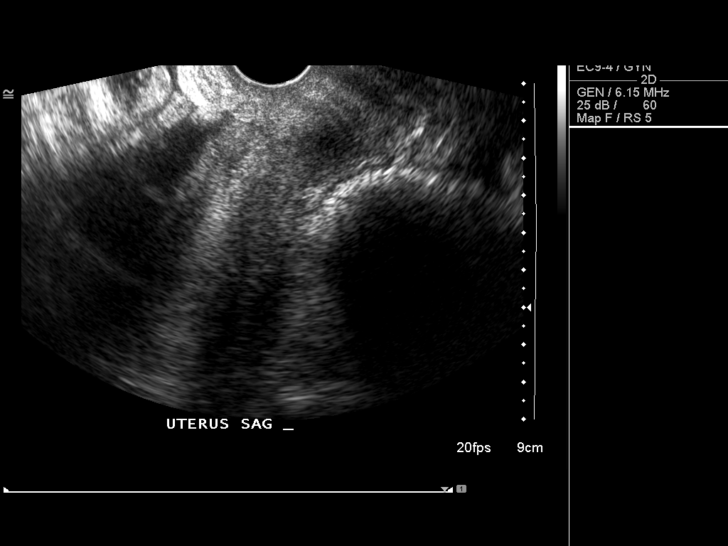
[im 38/71]
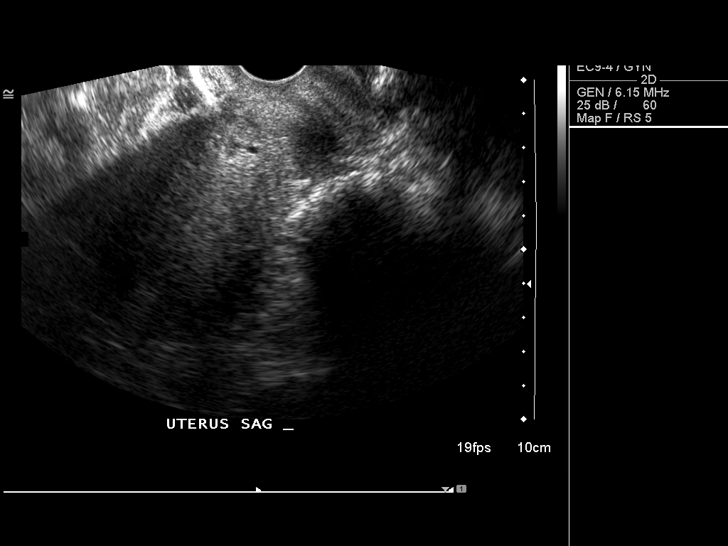
[im 44/71]
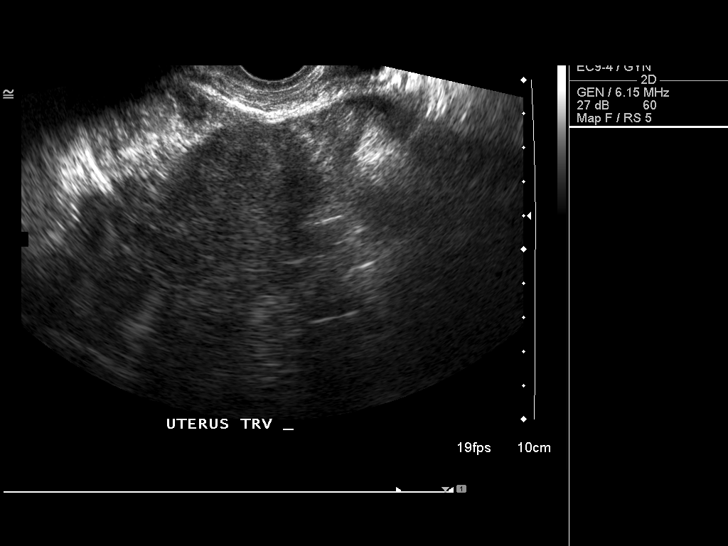
[im 47/71]
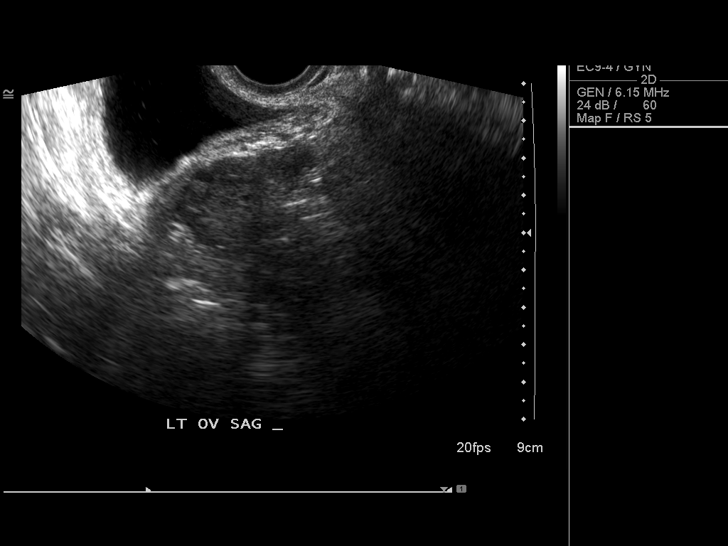
[im 53/71]
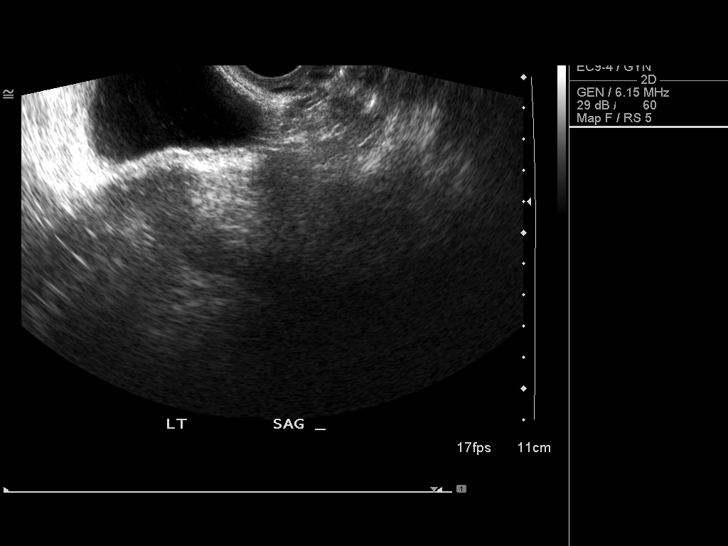
[im 59/71]
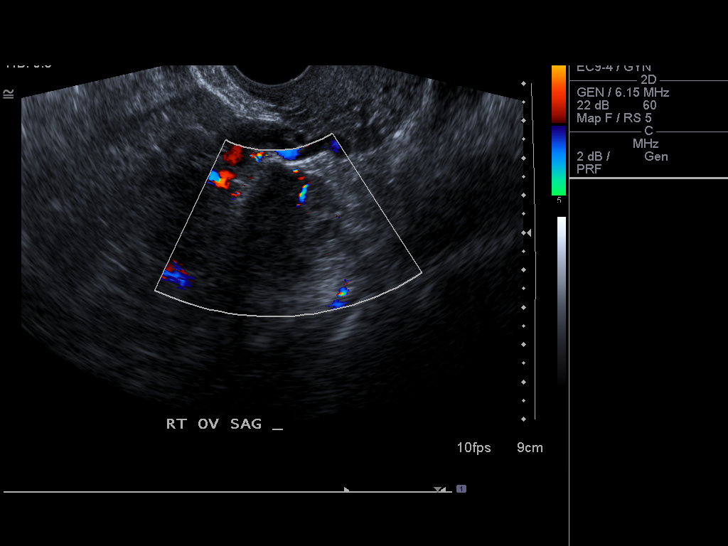
[im 65/71]
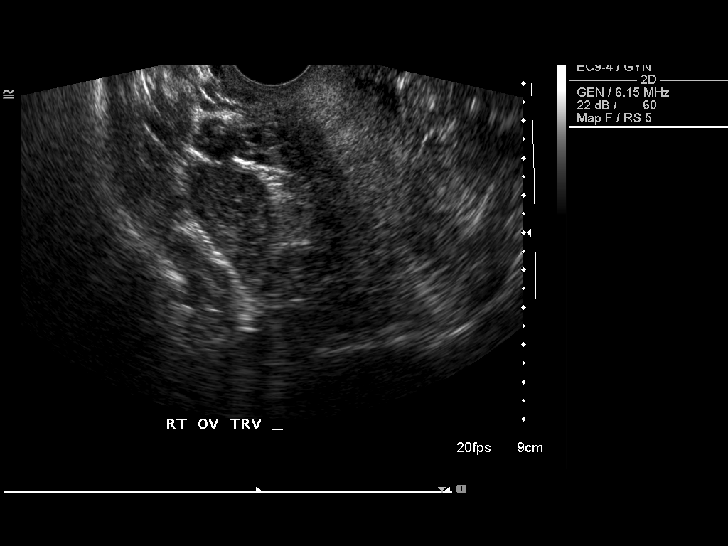
[im 71/71]
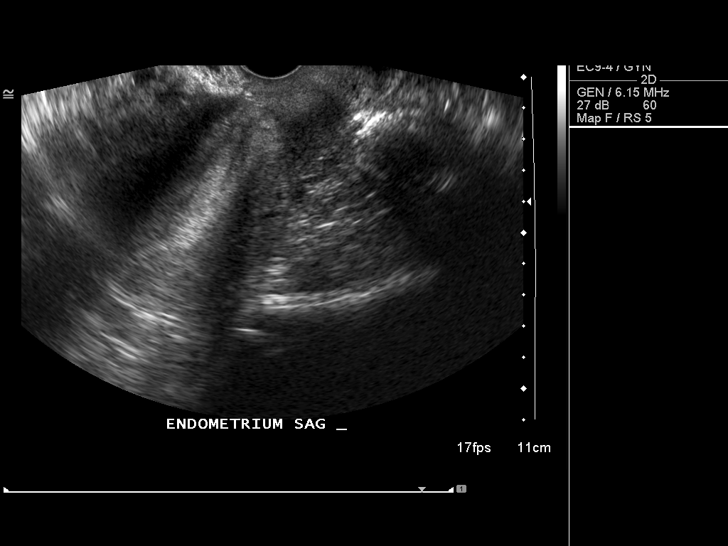

[14 of 25 positions shown; findings below may reference images not displayed]

FINDINGS: Uterus

Measurements: 10.0 x 6.2 x 5.7 cm. No fibroids or other mass
visualized.

Endometrium

Thickness: Normal thickness, 11 mm. No focal abnormality visualized.

Right ovary

Measurements: 3.2 x 2.3 x 2.3 cm. Normal appearance/no adnexal mass.

Left ovary

Measurements: 3.3 x 2.5 x 2.2 cm. Normal appearance/no adnexal mass.

Other findings

No free fluid. Visualization somewhat limited due to excessive
pelvic bowel gas.
IMPRESSION: Somewhat limited visualization due to bowel gas. No visible
abnormality in the uterus or ovaries.
